# Patient Record
Sex: Female | Born: 1965 | Race: Asian | Hispanic: No | Marital: Married | Smoking: Never smoker
Health system: Southern US, Community
[De-identification: ages and names within clinical notes are randomized; demographics above are authoritative.]

## PROBLEM LIST (undated history)

## (undated) DIAGNOSIS — Z789 Other specified health status: Secondary | ICD-10-CM

## (undated) HISTORY — PX: APPENDECTOMY: SHX54

---

## 1998-10-22 HISTORY — PX: TUBAL LIGATION: SHX77

## 2005-04-23 ENCOUNTER — Ambulatory Visit: Payer: Self-pay | Admitting: Obstetrics and Gynecology

## 2006-04-29 ENCOUNTER — Ambulatory Visit: Payer: Self-pay | Admitting: Obstetrics and Gynecology

## 2007-07-07 ENCOUNTER — Ambulatory Visit: Payer: Self-pay | Admitting: Obstetrics and Gynecology

## 2008-07-12 ENCOUNTER — Ambulatory Visit: Payer: Self-pay | Admitting: Obstetrics and Gynecology

## 2009-07-25 ENCOUNTER — Ambulatory Visit: Payer: Self-pay | Admitting: Obstetrics and Gynecology

## 2010-07-28 ENCOUNTER — Ambulatory Visit: Payer: Self-pay | Admitting: Obstetrics and Gynecology

## 2011-10-18 ENCOUNTER — Ambulatory Visit: Payer: Self-pay | Admitting: Obstetrics and Gynecology

## 2012-11-06 ENCOUNTER — Ambulatory Visit: Payer: Self-pay | Admitting: Obstetrics and Gynecology

## 2014-03-08 ENCOUNTER — Ambulatory Visit: Payer: Self-pay | Admitting: Obstetrics and Gynecology

## 2015-01-27 ENCOUNTER — Other Ambulatory Visit: Payer: Self-pay | Admitting: Obstetrics and Gynecology

## 2015-01-27 DIAGNOSIS — Z1231 Encounter for screening mammogram for malignant neoplasm of breast: Secondary | ICD-10-CM

## 2015-03-10 ENCOUNTER — Ambulatory Visit: Payer: Self-pay

## 2015-03-17 ENCOUNTER — Ambulatory Visit
Admission: RE | Admit: 2015-03-17 | Discharge: 2015-03-17 | Disposition: A | Discharge status: Home / Self Care | Payer: 59 | Source: Ambulatory Visit | Attending: Obstetrics and Gynecology | Admitting: Obstetrics and Gynecology

## 2015-03-17 DIAGNOSIS — Z1231 Encounter for screening mammogram for malignant neoplasm of breast: Secondary | ICD-10-CM | POA: Insufficient documentation

## 2016-04-05 DIAGNOSIS — N921 Excessive and frequent menstruation with irregular cycle: Secondary | ICD-10-CM | POA: Diagnosis not present

## 2016-04-06 ENCOUNTER — Other Ambulatory Visit: Payer: Self-pay | Admitting: Obstetrics and Gynecology

## 2016-04-06 DIAGNOSIS — Z1231 Encounter for screening mammogram for malignant neoplasm of breast: Secondary | ICD-10-CM

## 2016-04-06 DIAGNOSIS — Z0000 Encounter for general adult medical examination without abnormal findings: Secondary | ICD-10-CM | POA: Diagnosis not present

## 2016-04-27 DIAGNOSIS — N921 Excessive and frequent menstruation with irregular cycle: Secondary | ICD-10-CM | POA: Diagnosis not present

## 2016-05-10 ENCOUNTER — Ambulatory Visit: Payer: 59

## 2016-06-14 ENCOUNTER — Ambulatory Visit
Admission: RE | Admit: 2016-06-14 | Discharge: 2016-06-14 | Disposition: A | Discharge status: Home / Self Care | Payer: 59 | Source: Ambulatory Visit | Attending: Obstetrics and Gynecology | Admitting: Obstetrics and Gynecology

## 2016-06-14 DIAGNOSIS — Z1231 Encounter for screening mammogram for malignant neoplasm of breast: Secondary | ICD-10-CM | POA: Insufficient documentation

## 2016-06-21 DIAGNOSIS — E538 Deficiency of other specified B group vitamins: Secondary | ICD-10-CM | POA: Diagnosis not present

## 2016-06-21 DIAGNOSIS — R0789 Other chest pain: Secondary | ICD-10-CM | POA: Diagnosis not present

## 2016-06-21 DIAGNOSIS — R0602 Shortness of breath: Secondary | ICD-10-CM | POA: Diagnosis not present

## 2016-06-28 DIAGNOSIS — I208 Other forms of angina pectoris: Secondary | ICD-10-CM | POA: Diagnosis not present

## 2016-06-28 DIAGNOSIS — R0602 Shortness of breath: Secondary | ICD-10-CM | POA: Diagnosis not present

## 2016-06-28 DIAGNOSIS — R011 Cardiac murmur, unspecified: Secondary | ICD-10-CM | POA: Diagnosis not present

## 2016-07-05 DIAGNOSIS — R079 Chest pain, unspecified: Secondary | ICD-10-CM | POA: Diagnosis not present

## 2016-07-05 DIAGNOSIS — R0602 Shortness of breath: Secondary | ICD-10-CM | POA: Diagnosis not present

## 2016-07-05 DIAGNOSIS — K219 Gastro-esophageal reflux disease without esophagitis: Secondary | ICD-10-CM | POA: Diagnosis not present

## 2016-07-12 DIAGNOSIS — R0602 Shortness of breath: Secondary | ICD-10-CM | POA: Diagnosis not present

## 2016-07-12 DIAGNOSIS — R0789 Other chest pain: Secondary | ICD-10-CM | POA: Diagnosis not present

## 2016-12-20 DIAGNOSIS — E538 Deficiency of other specified B group vitamins: Secondary | ICD-10-CM | POA: Diagnosis not present

## 2016-12-20 DIAGNOSIS — R7989 Other specified abnormal findings of blood chemistry: Secondary | ICD-10-CM | POA: Diagnosis not present

## 2016-12-20 DIAGNOSIS — G43839 Menstrual migraine, intractable, without status migrainosus: Secondary | ICD-10-CM | POA: Diagnosis not present

## 2017-05-16 DIAGNOSIS — E538 Deficiency of other specified B group vitamins: Secondary | ICD-10-CM | POA: Diagnosis not present

## 2017-06-07 ENCOUNTER — Other Ambulatory Visit: Payer: Self-pay | Admitting: Obstetrics and Gynecology

## 2017-06-07 DIAGNOSIS — Z1231 Encounter for screening mammogram for malignant neoplasm of breast: Secondary | ICD-10-CM

## 2017-06-07 DIAGNOSIS — Z01419 Encounter for gynecological examination (general) (routine) without abnormal findings: Secondary | ICD-10-CM | POA: Diagnosis not present

## 2017-06-13 DIAGNOSIS — R7989 Other specified abnormal findings of blood chemistry: Secondary | ICD-10-CM | POA: Diagnosis not present

## 2017-06-13 DIAGNOSIS — G43839 Menstrual migraine, intractable, without status migrainosus: Secondary | ICD-10-CM | POA: Diagnosis not present

## 2017-06-13 DIAGNOSIS — E538 Deficiency of other specified B group vitamins: Secondary | ICD-10-CM | POA: Diagnosis not present

## 2017-06-20 DIAGNOSIS — E538 Deficiency of other specified B group vitamins: Secondary | ICD-10-CM | POA: Diagnosis not present

## 2017-06-21 ENCOUNTER — Other Ambulatory Visit: Payer: Self-pay | Admitting: Internal Medicine

## 2017-06-21 DIAGNOSIS — K760 Fatty (change of) liver, not elsewhere classified: Secondary | ICD-10-CM

## 2017-06-21 DIAGNOSIS — R945 Abnormal results of liver function studies: Secondary | ICD-10-CM

## 2017-06-21 DIAGNOSIS — E538 Deficiency of other specified B group vitamins: Secondary | ICD-10-CM | POA: Diagnosis not present

## 2017-06-21 DIAGNOSIS — R7989 Other specified abnormal findings of blood chemistry: Secondary | ICD-10-CM

## 2017-07-04 ENCOUNTER — Encounter: Payer: Self-pay | Admitting: Radiology

## 2017-07-04 ENCOUNTER — Ambulatory Visit
Admission: RE | Admit: 2017-07-04 | Discharge: 2017-07-04 | Disposition: A | Discharge status: Home / Self Care | Payer: 59 | Source: Ambulatory Visit | Attending: Obstetrics and Gynecology | Admitting: Obstetrics and Gynecology

## 2017-07-04 DIAGNOSIS — Z1231 Encounter for screening mammogram for malignant neoplasm of breast: Secondary | ICD-10-CM

## 2017-07-15 ENCOUNTER — Other Ambulatory Visit: Payer: Self-pay | Admitting: Internal Medicine

## 2017-07-15 DIAGNOSIS — R7989 Other specified abnormal findings of blood chemistry: Secondary | ICD-10-CM

## 2017-07-15 DIAGNOSIS — R945 Abnormal results of liver function studies: Secondary | ICD-10-CM

## 2017-09-07 ENCOUNTER — Other Ambulatory Visit: Payer: Self-pay | Admitting: Internal Medicine

## 2017-09-07 ENCOUNTER — Other Ambulatory Visit: Payer: Self-pay | Admitting: Obstetrics and Gynecology

## 2017-09-07 DIAGNOSIS — Z1231 Encounter for screening mammogram for malignant neoplasm of breast: Secondary | ICD-10-CM

## 2017-09-07 DIAGNOSIS — K760 Fatty (change of) liver, not elsewhere classified: Secondary | ICD-10-CM

## 2017-09-07 DIAGNOSIS — R7989 Other specified abnormal findings of blood chemistry: Secondary | ICD-10-CM

## 2018-01-24 DIAGNOSIS — R87610 Atypical squamous cells of undetermined significance on cytologic smear of cervix (ASC-US): Secondary | ICD-10-CM | POA: Diagnosis not present

## 2018-01-24 DIAGNOSIS — N841 Polyp of cervix uteri: Secondary | ICD-10-CM | POA: Diagnosis not present

## 2018-01-24 DIAGNOSIS — N72 Inflammatory disease of cervix uteri: Secondary | ICD-10-CM | POA: Diagnosis not present

## 2018-02-10 DIAGNOSIS — R7989 Other specified abnormal findings of blood chemistry: Secondary | ICD-10-CM | POA: Diagnosis not present

## 2018-02-10 DIAGNOSIS — M160 Bilateral primary osteoarthritis of hip: Secondary | ICD-10-CM | POA: Diagnosis not present

## 2018-02-10 DIAGNOSIS — M79604 Pain in right leg: Secondary | ICD-10-CM | POA: Diagnosis not present

## 2018-02-10 DIAGNOSIS — E538 Deficiency of other specified B group vitamins: Secondary | ICD-10-CM | POA: Diagnosis not present

## 2018-02-13 DIAGNOSIS — E538 Deficiency of other specified B group vitamins: Secondary | ICD-10-CM | POA: Diagnosis not present

## 2018-02-13 DIAGNOSIS — M79604 Pain in right leg: Secondary | ICD-10-CM | POA: Diagnosis not present

## 2018-02-13 DIAGNOSIS — R7989 Other specified abnormal findings of blood chemistry: Secondary | ICD-10-CM | POA: Diagnosis not present

## 2018-08-18 ENCOUNTER — Ambulatory Visit
Admission: RE | Admit: 2018-08-18 | Discharge: 2018-08-18 | Disposition: A | Discharge status: Home / Self Care | Payer: 59 | Source: Ambulatory Visit | Attending: Obstetrics and Gynecology | Admitting: Obstetrics and Gynecology

## 2018-08-18 ENCOUNTER — Other Ambulatory Visit: Payer: Self-pay

## 2018-08-18 DIAGNOSIS — Z1231 Encounter for screening mammogram for malignant neoplasm of breast: Secondary | ICD-10-CM | POA: Diagnosis present

## 2019-08-09 ENCOUNTER — Other Ambulatory Visit: Payer: Self-pay | Admitting: Obstetrics and Gynecology

## 2019-08-09 DIAGNOSIS — Z1231 Encounter for screening mammogram for malignant neoplasm of breast: Secondary | ICD-10-CM

## 2019-08-27 ENCOUNTER — Ambulatory Visit
Admission: RE | Admit: 2019-08-27 | Discharge: 2019-08-27 | Disposition: A | Discharge status: Home / Self Care | Payer: 59 | Source: Ambulatory Visit | Attending: Obstetrics and Gynecology | Admitting: Obstetrics and Gynecology

## 2019-08-27 DIAGNOSIS — Z1231 Encounter for screening mammogram for malignant neoplasm of breast: Secondary | ICD-10-CM

## 2020-05-08 ENCOUNTER — Other Ambulatory Visit: Payer: Self-pay | Admitting: Orthopedic Surgery

## 2020-05-08 DIAGNOSIS — M25561 Pain in right knee: Secondary | ICD-10-CM

## 2020-06-03 ENCOUNTER — Other Ambulatory Visit: Payer: Self-pay

## 2020-06-03 ENCOUNTER — Ambulatory Visit
Admission: RE | Admit: 2020-06-03 | Discharge: 2020-06-03 | Disposition: A | Discharge status: Home / Self Care | Payer: 59 | Source: Ambulatory Visit | Attending: Orthopedic Surgery | Admitting: Orthopedic Surgery

## 2020-06-03 DIAGNOSIS — M25561 Pain in right knee: Secondary | ICD-10-CM | POA: Insufficient documentation

## 2020-06-17 ENCOUNTER — Other Ambulatory Visit: Payer: Self-pay | Admitting: Orthopedic Surgery

## 2020-06-17 DIAGNOSIS — M25561 Pain in right knee: Secondary | ICD-10-CM

## 2020-06-17 NOTE — Progress Notes (Signed)
Bilateral standing hip to ankle x-rays on one cassette for evaluation of alignment 

## 2020-06-18 ENCOUNTER — Ambulatory Visit
Admission: RE | Admit: 2020-06-18 | Discharge: 2020-06-18 | Disposition: A | Discharge status: Home / Self Care | Payer: 59 | Source: Ambulatory Visit | Attending: Orthopedic Surgery | Admitting: Orthopedic Surgery

## 2020-06-18 ENCOUNTER — Ambulatory Visit
Admission: RE | Admit: 2020-06-18 | Discharge: 2020-06-18 | Disposition: A | Discharge status: Home / Self Care | Payer: 59 | Attending: Orthopedic Surgery | Admitting: Orthopedic Surgery

## 2020-06-18 ENCOUNTER — Other Ambulatory Visit: Payer: Self-pay

## 2020-06-18 DIAGNOSIS — M25561 Pain in right knee: Secondary | ICD-10-CM | POA: Diagnosis not present

## 2020-09-08 ENCOUNTER — Other Ambulatory Visit: Payer: Self-pay | Admitting: Obstetrics and Gynecology

## 2020-09-08 DIAGNOSIS — Z1231 Encounter for screening mammogram for malignant neoplasm of breast: Secondary | ICD-10-CM

## 2020-09-09 ENCOUNTER — Other Ambulatory Visit: Payer: Self-pay | Admitting: Orthopedic Surgery

## 2020-10-06 ENCOUNTER — Encounter: Payer: Self-pay | Admitting: Orthopedic Surgery

## 2020-10-10 ENCOUNTER — Ambulatory Visit
Admission: RE | Admit: 2020-10-10 | Discharge: 2020-10-10 | Disposition: A | Discharge status: Home / Self Care | Payer: 59 | Attending: Orthopedic Surgery | Admitting: Orthopedic Surgery

## 2020-10-10 ENCOUNTER — Encounter
Admission: RE | Disposition: A | Discharge status: Home / Self Care | Payer: Self-pay | Source: Home / Self Care | Attending: Orthopedic Surgery

## 2020-10-10 ENCOUNTER — Other Ambulatory Visit: Payer: Self-pay

## 2020-10-10 ENCOUNTER — Ambulatory Visit: Payer: 59 | Admitting: Anesthesiology

## 2020-10-10 ENCOUNTER — Encounter: Payer: Self-pay | Admitting: Orthopedic Surgery

## 2020-10-10 DIAGNOSIS — X58XXXA Exposure to other specified factors, initial encounter: Secondary | ICD-10-CM | POA: Diagnosis not present

## 2020-10-10 DIAGNOSIS — M238X1 Other internal derangements of right knee: Secondary | ICD-10-CM | POA: Diagnosis not present

## 2020-10-10 DIAGNOSIS — S83241A Other tear of medial meniscus, current injury, right knee, initial encounter: Secondary | ICD-10-CM | POA: Diagnosis present

## 2020-10-10 HISTORY — PX: KNEE ARTHROSCOPY WITH MENISCAL REPAIR: SHX5653

## 2020-10-10 HISTORY — DX: Other specified health status: Z78.9

## 2020-10-10 SURGERY — ARTHROSCOPY, KNEE, WITH MENISCUS REPAIR
Anesthesia: General | Site: Knee | Laterality: Right

## 2020-10-10 MED ORDER — ACETAMINOPHEN 325 MG PO TABS: 325.0000 mg | ORAL_TABLET | ORAL | Status: DC | PRN

## 2020-10-10 MED ORDER — OXYCODONE HCL 5 MG PO TABS: 5.0000 mg | ORAL_TABLET | ORAL | 0 refills | Status: AC | PRN

## 2020-10-10 MED ORDER — ACETAMINOPHEN 160 MG/5ML PO SOLN: 325.0000 mg | ORAL | Status: DC | PRN

## 2020-10-10 MED ORDER — ONDANSETRON HCL 4 MG/2ML IJ SOLN
INTRAMUSCULAR | Status: DC | PRN
  Administered 2020-10-10: 4 mg via INTRAVENOUS

## 2020-10-10 MED ORDER — LACTATED RINGERS IV SOLN: INTRAVENOUS | Status: DC

## 2020-10-10 MED ORDER — DEXAMETHASONE SODIUM PHOSPHATE 10 MG/ML IJ SOLN
INTRAMUSCULAR | Status: DC | PRN
  Administered 2020-10-10: 4 mg via INTRAVENOUS

## 2020-10-10 MED ORDER — ASPIRIN EC 325 MG PO TBEC: 325.0000 mg | DELAYED_RELEASE_TABLET | Freq: Every day | ORAL | 0 refills | Status: AC

## 2020-10-10 MED ORDER — LIDOCAINE-EPINEPHRINE 1 %-1:100000 IJ SOLN
INTRAMUSCULAR | Status: DC | PRN
  Administered 2020-10-10 (×2): 5 mL via INTRAMUSCULAR

## 2020-10-10 MED ORDER — FENTANYL CITRATE (PF) 100 MCG/2ML IJ SOLN
INTRAMUSCULAR | Status: DC | PRN
  Administered 2020-10-10 (×2): 25 ug via INTRAVENOUS
  Administered 2020-10-10: 50 ug via INTRAVENOUS

## 2020-10-10 MED ORDER — OXYCODONE HCL 5 MG PO TABS
5.0000 mg | ORAL_TABLET | Freq: Once | ORAL | Status: AC | PRN
  Administered 2020-10-10: 5 mg via ORAL

## 2020-10-10 MED ORDER — GLYCOPYRROLATE 0.2 MG/ML IJ SOLN
INTRAMUSCULAR | Status: DC | PRN
  Administered 2020-10-10: .2 mg via INTRAVENOUS

## 2020-10-10 MED ORDER — LIDOCAINE HCL (CARDIAC) PF 100 MG/5ML IV SOSY
PREFILLED_SYRINGE | INTRAVENOUS | Status: DC | PRN
  Administered 2020-10-10: 60 mg via INTRAVENOUS

## 2020-10-10 MED ORDER — ACETAMINOPHEN 500 MG PO TABS: 1000.0000 mg | ORAL_TABLET | Freq: Three times a day (TID) | ORAL | 2 refills | Status: AC

## 2020-10-10 MED ORDER — ONDANSETRON 4 MG PO TBDP: 4.0000 mg | ORAL_TABLET | Freq: Three times a day (TID) | ORAL | 0 refills | Status: AC | PRN

## 2020-10-10 MED ORDER — KETOROLAC TROMETHAMINE 30 MG/ML IJ SOLN
INTRAMUSCULAR | Status: DC | PRN
  Administered 2020-10-10: 30 mg via INTRAVENOUS

## 2020-10-10 MED ORDER — ACETAMINOPHEN 10 MG/ML IV SOLN
INTRAVENOUS | Status: DC | PRN
  Administered 2020-10-10: 1000 mg via INTRAVENOUS

## 2020-10-10 MED ORDER — IBUPROFEN 800 MG PO TABS: 800.0000 mg | ORAL_TABLET | Freq: Three times a day (TID) | ORAL | 1 refills | Status: AC

## 2020-10-10 MED ORDER — OXYCODONE HCL 5 MG/5ML PO SOLN: 5.0000 mg | Freq: Once | ORAL | Status: AC | PRN

## 2020-10-10 MED ORDER — KETOROLAC TROMETHAMINE 30 MG/ML IJ SOLN: 15.0000 mg | Freq: Once | INTRAMUSCULAR | Status: DC | PRN

## 2020-10-10 MED ORDER — CEFAZOLIN SODIUM-DEXTROSE 2-4 GM/100ML-% IV SOLN
2.0000 g | INTRAVENOUS | Status: AC
  Administered 2020-10-10: 2 g via INTRAVENOUS

## 2020-10-10 MED ORDER — PROPOFOL 10 MG/ML IV BOLUS
INTRAVENOUS | Status: DC | PRN
  Administered 2020-10-10: 120 mg via INTRAVENOUS
  Administered 2020-10-10: 30 mg via INTRAVENOUS

## 2020-10-10 MED ORDER — ONDANSETRON HCL 4 MG/2ML IJ SOLN: 4.0000 mg | Freq: Once | INTRAMUSCULAR | Status: DC | PRN

## 2020-10-10 MED ORDER — FENTANYL CITRATE PF 50 MCG/ML IJ SOSY
25.0000 ug | PREFILLED_SYRINGE | INTRAMUSCULAR | Status: DC | PRN
  Administered 2020-10-10 (×2): 50 ug via INTRAVENOUS

## 2020-10-10 SURGICAL SUPPLY — 45 items
ADAPTER IRRIG TUBE 2 SPIKE SOL (ADAPTER) ×4 IMPLANT
ADPR TBG 2 SPK PMP STRL ASCP (ADAPTER) ×2
ANCH SUT SWLK 19.1X4.75 (Anchor) ×1 IMPLANT
ANCHOR SUT BIO SW 4.75X19.1 (Anchor) ×1 IMPLANT
APL PRP STRL LF DISP 70% ISPRP (MISCELLANEOUS) ×1
BLADE SURG 15 STRL LF DISP TIS (BLADE) ×1 IMPLANT
BLADE SURG 15 STRL SS (BLADE) ×2
BLADE SURG SZ11 CARB STEEL (BLADE) ×2 IMPLANT
BNDG ESMARK 6X12 TAN STRL LF (GAUZE/BANDAGES/DRESSINGS) ×2 IMPLANT
BUR RADIUS 3.5 (BURR) ×1 IMPLANT
BUR RADIUS 4.0X18.5 (BURR) ×1 IMPLANT
CHLORAPREP W/TINT 26 (MISCELLANEOUS) ×2 IMPLANT
COOLER POLAR GLACIER W/PUMP (MISCELLANEOUS) ×2 IMPLANT
COVER LIGHT HANDLE UNIVERSAL (MISCELLANEOUS) ×4 IMPLANT
DRAPE IMP U-DRAPE 54X76 (DRAPES) ×2 IMPLANT
GAUZE SPONGE 4X4 12PLY STRL (GAUZE/BANDAGES/DRESSINGS) ×2 IMPLANT
GLOVE SURG ENC MOIS LTX SZ7.5 (GLOVE) ×6 IMPLANT
GOWN STRL REUS W/ TWL LRG LVL3 (GOWN DISPOSABLE) ×1 IMPLANT
GOWN STRL REUS W/TWL LRG LVL3 (GOWN DISPOSABLE) ×4
IMP SYS 2ND FIX PEEK 4.75X19.1 (Miscellaneous) ×2 IMPLANT
IMPL SYS 2ND FX PEEK 4.75X19.1 (Miscellaneous) IMPLANT
IMPL SYS MENISCAL ROOT REPAIR (Orthopedic Implant) ×1 IMPLANT
IV LACTATED RINGER IRRG 3000ML (IV SOLUTION) ×8
IV LR IRRIG 3000ML ARTHROMATIC (IV SOLUTION) ×4 IMPLANT
KIT TURNOVER KIT A (KITS) ×2 IMPLANT
MANIFOLD NEPTUNE II (INSTRUMENTS) ×2 IMPLANT
MAT ABSORB  FLUID 56X50 GRAY (MISCELLANEOUS) ×2
MAT ABSORB FLUID 56X50 GRAY (MISCELLANEOUS) ×2 IMPLANT
PACK ARTHROSCOPY KNEE (MISCELLANEOUS) ×2 IMPLANT
PAD WRAPON POLAR KNEE (MISCELLANEOUS) ×1 IMPLANT
PADDING CAST BLEND 6X4 STRL (MISCELLANEOUS) ×1 IMPLANT
PADDING STRL CAST 6IN (MISCELLANEOUS) ×1
PENCIL SMOKE EVACUATOR (MISCELLANEOUS) ×2 IMPLANT
SET TUBE SUCT SHAVER OUTFL 24K (TUBING) ×2 IMPLANT
SET TUBE TIP INTRA-ARTICULAR (MISCELLANEOUS) ×2 IMPLANT
SUT ETHILON 3 0 FSLX (SUTURE) ×2 IMPLANT
SUT MNCRL 4-0 (SUTURE) ×2
SUT MNCRL 4-0 27XMFL (SUTURE) ×1
SUT VIC AB 2-0 CT2 27 (SUTURE) ×1 IMPLANT
SUT VIC AB 3-0 SH 27 (SUTURE) ×2
SUT VIC AB 3-0 SH 27X BRD (SUTURE) IMPLANT
SUTURE MNCRL 4-0 27XMF (SUTURE) IMPLANT
TUBING ARTHRO INFLOW-ONLY STRL (TUBING) ×2 IMPLANT
WAND WEREWOLF FLOW 90D (MISCELLANEOUS) ×2 IMPLANT
WRAPON POLAR PAD KNEE (MISCELLANEOUS) ×2

## 2020-10-10 NOTE — Transfer of Care (Signed)
Immediate Anesthesia Transfer of Care Note  Patient: Andrea Le  Procedure(s) Performed: Right knee medial meniscus root repair, chondroplasty (Right: Knee)  Patient Location: PACU  Anesthesia Type: General  Level of Consciousness: awake, alert  and patient cooperative  Airway and Oxygen Therapy: Patient Spontanous Breathing and Patient connected to supplemental oxygen  Post-op Assessment: Post-op Vital signs reviewed, Patient's Cardiovascular Status Stable, Respiratory Function Stable, Patent Airway and No signs of Nausea or vomiting  Post-op Vital Signs: Reviewed and stable  Complications: No notable events documented.

## 2020-10-10 NOTE — H&P (Signed)
Paper H&P to be scanned into permanent record. H&P reviewed. No significant changes noted.  

## 2020-10-10 NOTE — Anesthesia Preprocedure Evaluation (Signed)
Anesthesia Evaluation  Patient identified by MRN, date of birth, ID band Patient awake    Reviewed: Allergy & Precautions, NPO status , Patient's Chart, lab work & pertinent test results  Airway Mallampati: II  TM Distance: >3 FB Neck ROM: Full    Dental no notable dental hx.    Pulmonary neg pulmonary ROS,    Pulmonary exam normal breath sounds clear to auscultation       Cardiovascular Exercise Tolerance: Good negative cardio ROS Normal cardiovascular exam Rhythm:Regular Rate:Normal     Neuro/Psych negative neurological ROS  negative psych ROS   GI/Hepatic negative GI ROS, Neg liver ROS,   Endo/Other  negative endocrine ROS  Renal/GU negative Renal ROS  negative genitourinary   Musculoskeletal  (+) Arthritis ,   Abdominal Normal abdominal exam  (+) - obese,  Abdomen: soft.    Peds negative pediatric ROS (+)  Hematology negative hematology ROS (+)   Anesthesia Other Findings   Reproductive/Obstetrics negative OB ROS                             Anesthesia Physical Anesthesia Plan  ASA: 3  Anesthesia Plan: General   Post-op Pain Management:    Induction:   PONV Risk Score and Plan: Treatment may vary due to age or medical condition and Midazolam  Airway Management Planned: LMA  Additional Equipment:   Intra-op Plan:   Post-operative Plan:   Informed Consent:     Dental advisory given  Plan Discussed with: CRNA  Anesthesia Plan Comments:         Anesthesia Quick Evaluation  There are no problems to display for this patient.   No flowsheet data found. No flowsheet data found.  Risks and benefits of anesthesia discussed at length, patient or surrogate demonstrates understanding. Appropriately NPO. Plan to proceed with anesthesia.  Corlis Leak, MD 10/10/20

## 2020-10-10 NOTE — Op Note (Signed)
DATE: 10/10/2020   PRE-OP DIAGNOSIS:  1. Right medial meniscus root tear 2. Right Medial compartment and patellofemoral compartment degenerative changes  POST-OP DIAGNOSIS:  1. Right medial meniscus root tear 2. Right Medial compartment and patellofemoral compartment degenerative changes 3. Right suprapatellar pouch adhesions  PROCEDURES:  1. Right knee medial meniscus root repair  2. Right knee lysis of adhesions in suprapatellar pouch   SURGEON:  Novella Olive, MD   ASSISTANT(S):  Dedra Skeens, [place]   ANESTHESIA: general   TOTAL IV FLUIDS: See anesthesia record   ESTIMATED BLOOD LOSS: 5cc   TOURNIQUET TIME:    DRAINS:  None.   SPECIMENS: None   IMPLANTS:  - Arthrex Biocomposite SwiveLock (x1)     COMPLICATIONS: None   INDICATIONS: Andrea Le is a 55 y.o. female with R knee pain that has failed non-operative management. Clinical exam and radiographic studies were notable for right knee pain and swelling with instances of giving way with associated popping and catching on the medial aspect of her knee. Additionally, MRI showed a medial meniscus root tear with mild degenerative changes to medial and patellofemoral compartments with neutral mechanical alignment. Given the poor long-term prognosis of a meniscus root tear and high likelihood of significant progression of osteoarthritis, we elected to proceed with the above procedure after a discussion of the risks, benefits, and alternatives to surgery.    OPERATIVE FINDINGS:    Examination under anesthesia: A careful examination under anesthesia was performed.  Passive range of motion was: Hyperextension: 2.  Extension: 0.  Flexion: 120.  Lachman: normal. Pivot Shift: normal.  Posterior drawer: normal.  Varus stability in full extension: normal.  Varus stability in 30 degrees of flexion: normal.  Valgus stability in full extension: normal.  Valgus stability in 30 degrees of flexion: normal.    Intra-operative findings: A thorough arthroscopic examination of the knee was performed.  The findings are: 1. Suprapatellar pouch: Significant adhesions about entirety of suprapatellar pouch with decreased volume 2. Undersurface of median ridge: Focal Grade 2 degenerative changes 3. Medial patellar facet: Grade 1 degenerative changes 4. Lateral patellar facet: Grade 1 degenerative changes 5. Trochlea: Grade 3 degenerative changes over central aspect of trochlear groove 6. Lateral gutter/popliteus tendon: Normal 7. Hoffa's fat pad: Inflamed 8. Medial gutter/plica: Normal 9. ACL: Normal 10. PCL: Normal 11. Medial meniscus: Complete tear of the medial meniscus at the posterior root 12. Medial compartment cartilage: diffuse areas of Grade 3 degenerative changes on MFC with relatively normal immediate weight bearing portion of MFC, grade 2 changes on medial aspect of the tibial plateau 13. Lateral meniscus: Normal 14. Lateral compartment cartilage: Normal  DESCRIPTION OF PROCEDURE: I identified Andrea Le in the pre-operative holding area.  I marked the operative knee with my initials. I reviewed the risks and benefits of the proposed surgical intervention and the patient (and/or patient's guardian) wished to proceed. The patient was transferred to the operative suite and placed in the supine position with all bony prominences padded.  Anesthesia was administered. Appropriate IV antibiotics were administered prior to incision. The extremity was then prepped and draped in standard fashion. A time out was performed confirming the correct extremity, correct patient, and correct procedure.   Arthroscopy portals were marked. Local anesthetic was injected to the planned portal sites. The anterolateral portal was established with an 11 blade. The arthroscope was placed in the anterolateral portal and then into the suprapatellar pouch.  A diagnostic knee scope was completed with the above  findings. Next the medial portal was established under needle localization. A shaver was used to perform a lysis of adhesions in the suprapatellar pouch. Afterwards, there was a more appropriate suprapatellar pouch volume.  Next, the MCL was pie-crusted to improve visualization of the posterior horn. The meniscus root tear was identified and probed to confirm our findings. Next, an Arthrex meniscus root aiming guide was used to mark out the tibial incision. An approximately 4cm vertical incision was made medial to the tibial tubercle. This was carried down to the sartorius fascia with bovie electrocautery, and the fascia was incised. An elevator was used to clear periosteum from the tibia in the area of the anticipated bone tunnel. Hemostasis was achieved. The guide was reinserted into the tibia and was placed over the anatomic footprint of the medial meniscus root. We then used a 6.77mm FlipCutter to drill into the tibia and create a 61mm socket for the meniscus root. A FiberStick was passed through our tibial tunnel and into the joint. This was retrieved and passed through the anterolateral portal.   Next, using a Meniscus Scorpion, an 0-FiberLink suture was passed just medial to the meniscus root in a luggage tag configuration.  A second stitch was passed in a similar fashion medial to the first stitch.  This allowed for excellent purchase of the meniscus root.  We ensured there was no soft tissue bridge between the sutures and pulled the passing stitch from the FiberStick through the anteromedial portal. We then used the passing stitch to bring the sutures in the meniscus out through the tibial tunnel. These were then passed through an [employer] anchor. Anchor hole was drilled ~3cm distal previously drilled tibial tunnel. Anchor was inserted with appropriate tension while visualizing the repair with the arthroscope, and this achieved excellent interference fit. The meniscus root was probed and found to  be stable.   Any loose bony debris was removed from the knee joint with a shaver and excess fluid was evacuated from the joint. Closure of the portals with 3-0 Nylon was performed. The sartorius fascia was closed with 2-0 vicryl. The subdermal layer of the tibial incision was closed with 2-0 vicryl and the skin was closed with 4-0 Monocryl in a running fashion and Dermabond.  Xeroform gauze and dry sterile dressings were applied. A PolarCare and hinged knee brace were also applied.   Instrument, sponge, and needle counts were correct prior to wound closure and at the conclusion of the case.   Of note, assistance from a PA was essential to performing the surgery.  PA was present for the entire surgery.  PA assisted with patient positioning, retraction, instrumentation, and wound closure. The surgery would have been more difficult and had longer operative time without PA assistance.   Additionally, this case had increased complexity compared to standard meniscus repair given that the patient had a complete tear of the meniscus root. Repair of this tear involved making a separate open incision, drilling a tunnel through the tibia, and using an implant in the tibia for fixation, all of which would otherwise not occur for standard meniscal repairs.  DISPOSITION: PACU - hemodynamically stable.    POSTOPERATIVE PLAN: The patient will be discharged home today.     Non-weight bearing x 4 weeks. 50% WB from weeks 4-6. ASA for DVT ppx x 4 weeks, Narcotic medication, NSAID, and acetaminophen as discussed pre-operatively. The patient will be attending physical therapy beginning 3-4 days post-op. Physical therapy per Meniscus Root Repair Rehab Guidelines.  Patient to return to clinic 10-14 days postop for suture removal.

## 2020-10-10 NOTE — Discharge Instructions (Signed)
Arthroscopic Knee Surgery - Meniscus Repair   Post-Op Instructions   1. Bracing or crutches: Crutches will be provided at the time of discharge from the surgery center. Keep brace locked in extension at all times except as directed by physical therapy.    2. Ice: You may be provided with a device (Polar Care) that allows you to ice the affected area effectively. Otherwise you can ice manually.    3. Driving:  Plan on not driving for at least four weeks. Please note that you are advised NOT to drive while taking narcotic pain medications as you may be impaired and unsafe to drive.   4. Activity: Ankle pumps several times an hour while awake to prevent blood clots. Weight bearing: NO WEIGHT BEARING FOR 4 WEEKS. Use crutches for at least 4 weeks, if not 6 based on your surgery. Bending and straightening the knee is unlimited, but do not flex your knee past 90 degrees until cleared by your therapist. Elevate knee above heart level as much as possible for one week. Avoid standing more than 5 minutes (consecutively) for the first week. No exercise involving the knee until cleared by the surgeon or physical therapist.  Avoid long distance travel for 4 weeks.   5. Medications:  - You have been provided a prescription for narcotic pain medicine. After surgery, take 1-2 narcotic tablets every 4 hours if needed for severe pain. If it has tylenol (acetaminophen), please do not take a total of more than 3000mg/day of tylenol.  - A prescription for anti-nausea medication will be provided in case the narcotic medicine causes nausea - take 1 tablet every 6 hours only if nauseated.  - Take ibuprofen 800 mg every 8 hours with food to reduce post-operative knee swelling. DO NOT STOP IBUPROFEN POST-OP UNTIL INSTRUCTED TO DO SO at first post-op office visit (10-14 days after surgery).  - Take enteric coated aspirin 325 mg once daily for 4 weeks to prevent blood clots.  -Take tylenol 1000 every 8 hours for pain.  May  stop tylenol 3 days after surgery or when you are having minimal pain. If your narcotic has tylenol (acetaminophen), please do not take a total of more than 3000mg/day of tylenol.    If you are taking prescription medication for anxiety, depression, insomnia, muscle spasm, chronic pain, or for attention deficit disorder you are advised that you are at a higher risk of adverse effects with use of narcotics post-op, including narcotic addiction/dependence, depressed breathing, death. If you use non-prescribed substances: alcohol, marijuana, cocaine, heroin, methamphetamines, etc., you are at a higher risk of adverse effects with use of narcotics post-op, including narcotic addiction/dependence, depressed breathing, death. You are advised that taking > 50 morphine milligram equivalents (MME) of narcotic pain medication per day results in twice the risk of overdose or death. For your prescription provided: oxycodone 5 mg - taking more than 6 tablets per day. Be advised that we will prescribe narcotics short-term, for acute post-operative pain only - 1 week for minor operations such as knee arthroscopy for meniscus tear resection, and 3 weeks for major operations such as knee repair/reconstruction surgeries.   6. Bandages: The physical therapist should change the bandages at the first post-op appointment. If needed, the dressing supplies have been provided to you. You may shower after this with waterproof bandaids covering the incisions.    7. Physical Therapy: 2 times per week for the first 4 weeks, then 1-2 times per week from weeks 4-8 post-op. Therapy typically   starts on post operative Day 3 or 4. You have been provided an order for physical therapy. The therapist will provide home exercises.   8. Work: May return to full work when off of crutches. May do light duty/desk job in approximately 1-2 weeks when off of narcotics, pain is well-controlled, and swelling has decreased.   9. Post-Op  Appointments: Your first post-op appointment will be with Dr. Cozzolino in approximately 2 weeks time.    If you find that they have not been scheduled please call the Orthopaedic Appointment front desk at [phone].   

## 2020-10-10 NOTE — Anesthesia Procedure Notes (Signed)
Procedure Name: LMA Insertion Date/Time: 10/10/2020 7:45 AM Performed by: Lily Kocher, CRNA Pre-anesthesia Checklist: Patient identified, Patient being monitored, Timeout performed, Emergency Drugs available and Suction available Patient Re-evaluated:Patient Re-evaluated prior to induction Oxygen Delivery Method: Circle system utilized Preoxygenation: Pre-oxygenation with 100% oxygen Induction Type: IV induction Ventilation: Mask ventilation without difficulty LMA: LMA inserted LMA Size: 4.0 Tube type: Oral Number of attempts: 1 Placement Confirmation: positive ETCO2 and breath sounds checked- equal and bilateral Tube secured with: Tape Dental Injury: Teeth and Oropharynx as per pre-operative assessment

## 2020-10-10 NOTE — Anesthesia Postprocedure Evaluation (Signed)
Anesthesia Post Note  Patient: Andrea Le  Procedure(s) Performed: Right knee athroscopy with lysis of adheisions, medial meniscus root repair and chondroplasty (Right: Knee)     Patient location during evaluation: PACU Anesthesia Type: General Level of consciousness: awake and alert Pain management: pain level controlled Vital Signs Assessment: post-procedure vital signs reviewed and stable Respiratory status: spontaneous breathing, nonlabored ventilation, respiratory function stable and patient connected to nasal cannula oxygen Cardiovascular status: blood pressure returned to baseline and stable Postop Assessment: no apparent nausea or vomiting Anesthetic complications: no   No notable events documented.  Edwyna Ready

## 2020-10-13 ENCOUNTER — Encounter: Payer: Self-pay | Admitting: Orthopedic Surgery

## 2021-01-26 ENCOUNTER — Inpatient Hospital Stay: Admission: RE | Admit: 2021-01-26 | Payer: 59 | Source: Ambulatory Visit

## 2021-06-04 ENCOUNTER — Ambulatory Visit
Admission: RE | Admit: 2021-06-04 | Discharge: 2021-06-04 | Disposition: A | Discharge status: Home / Self Care | Payer: 59 | Source: Ambulatory Visit | Attending: Obstetrics and Gynecology | Admitting: Obstetrics and Gynecology

## 2021-06-04 DIAGNOSIS — Z1231 Encounter for screening mammogram for malignant neoplasm of breast: Secondary | ICD-10-CM | POA: Insufficient documentation

## 2021-06-11 ENCOUNTER — Other Ambulatory Visit: Payer: Self-pay | Admitting: Obstetrics and Gynecology

## 2021-06-11 DIAGNOSIS — R928 Other abnormal and inconclusive findings on diagnostic imaging of breast: Secondary | ICD-10-CM

## 2021-06-11 DIAGNOSIS — N63 Unspecified lump in unspecified breast: Secondary | ICD-10-CM

## 2021-06-26 ENCOUNTER — Ambulatory Visit
Admission: RE | Admit: 2021-06-26 | Discharge: 2021-06-26 | Disposition: A | Discharge status: Home / Self Care | Payer: 59 | Source: Ambulatory Visit | Attending: Obstetrics and Gynecology | Admitting: Obstetrics and Gynecology

## 2021-06-26 DIAGNOSIS — R928 Other abnormal and inconclusive findings on diagnostic imaging of breast: Secondary | ICD-10-CM | POA: Diagnosis present

## 2021-06-26 DIAGNOSIS — N63 Unspecified lump in unspecified breast: Secondary | ICD-10-CM

## 2021-07-01 ENCOUNTER — Other Ambulatory Visit: Payer: Self-pay | Admitting: Obstetrics and Gynecology

## 2021-07-01 DIAGNOSIS — N63 Unspecified lump in unspecified breast: Secondary | ICD-10-CM

## 2021-07-01 DIAGNOSIS — R928 Other abnormal and inconclusive findings on diagnostic imaging of breast: Secondary | ICD-10-CM

## 2021-07-21 ENCOUNTER — Ambulatory Visit
Admission: RE | Admit: 2021-07-21 | Discharge: 2021-07-21 | Disposition: A | Discharge status: Home / Self Care | Payer: 59 | Source: Ambulatory Visit | Attending: Obstetrics and Gynecology | Admitting: Obstetrics and Gynecology

## 2021-07-21 DIAGNOSIS — N63 Unspecified lump in unspecified breast: Secondary | ICD-10-CM | POA: Insufficient documentation

## 2021-07-21 DIAGNOSIS — R928 Other abnormal and inconclusive findings on diagnostic imaging of breast: Secondary | ICD-10-CM

## 2021-07-21 HISTORY — PX: BREAST BIOPSY: SHX20

## 2021-07-22 LAB — SURGICAL PATHOLOGY

## 2021-07-31 ENCOUNTER — Encounter: Payer: Self-pay | Admitting: *Deleted

## 2021-07-31 NOTE — Progress Notes (Signed)
Referral recieved from [facility] for benign breast mass.  Appointment scheduled for surgical consultation with Dr. Lemar Livings per patient request.   No further needs at this time.

## 2021-12-02 ENCOUNTER — Other Ambulatory Visit: Payer: Self-pay | Admitting: General Surgery

## 2021-12-02 DIAGNOSIS — R928 Other abnormal and inconclusive findings on diagnostic imaging of breast: Secondary | ICD-10-CM

## 2021-12-02 DIAGNOSIS — Z1231 Encounter for screening mammogram for malignant neoplasm of breast: Secondary | ICD-10-CM

## 2021-12-14 NOTE — Addendum Note (Signed)
Addended by: Robert Bellow on: 12/14/2021 11:42 AM   Modules accepted: Orders

## 2022-01-06 ENCOUNTER — Ambulatory Visit
Admission: RE | Admit: 2022-01-06 | Discharge: 2022-01-06 | Disposition: A | Discharge status: Home / Self Care | Payer: 59 | Source: Ambulatory Visit | Attending: General Surgery | Admitting: General Surgery

## 2022-01-06 DIAGNOSIS — R928 Other abnormal and inconclusive findings on diagnostic imaging of breast: Secondary | ICD-10-CM

## 2022-05-27 ENCOUNTER — Other Ambulatory Visit: Payer: Self-pay | Admitting: General Surgery

## 2022-05-27 DIAGNOSIS — N63 Unspecified lump in unspecified breast: Secondary | ICD-10-CM

## 2022-06-23 IMAGING — US US BREAST*L* LIMITED INC AXILLA
2 series · 8 of 8 positions shown · non-contrast
Comparison: Previous exam(s).

CLINICAL DATA: Recall from screening mammography, possible new mass
involving the UPPER OUTER QUADRANT of the LEFT breast at posterior
depth.

EXAM:
DIGITAL DIAGNOSTIC UNILATERAL LEFT MAMMOGRAM WITH TOMOSYNTHESIS AND
CAD; ULTRASOUND LEFT BREAST LIMITED
TECHNIQUE: Left digital diagnostic mammography and breast tomosynthesis was
performed. The images were evaluated with computer-aided detection.;
Targeted ultrasound examination of the left breast was performed.

[Series 1: us breast*left* limited inc axilla · 0.06mm/px · 7 of 7 slices shown (1 of 2)]
[im 1/7]
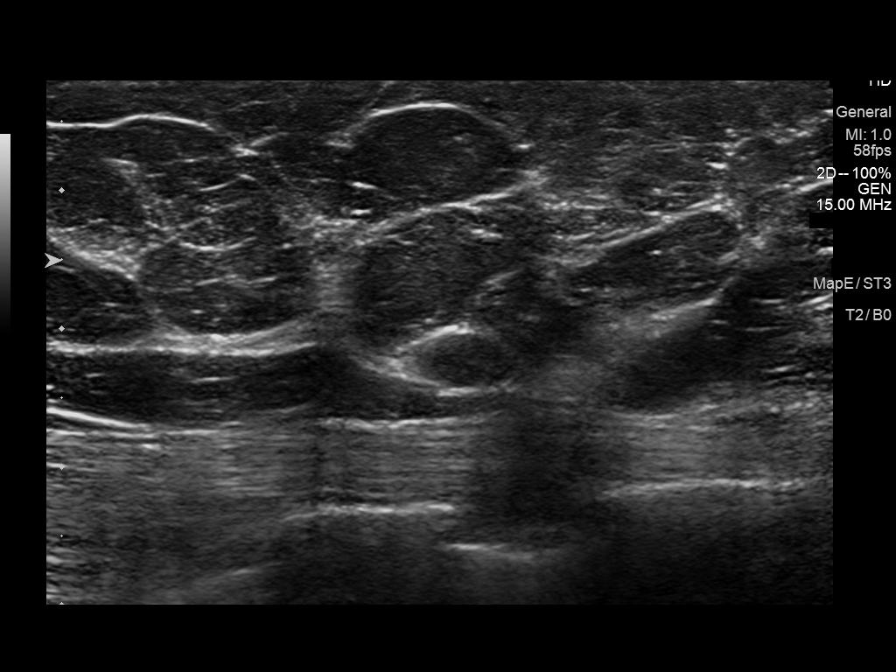
[im 2/7]
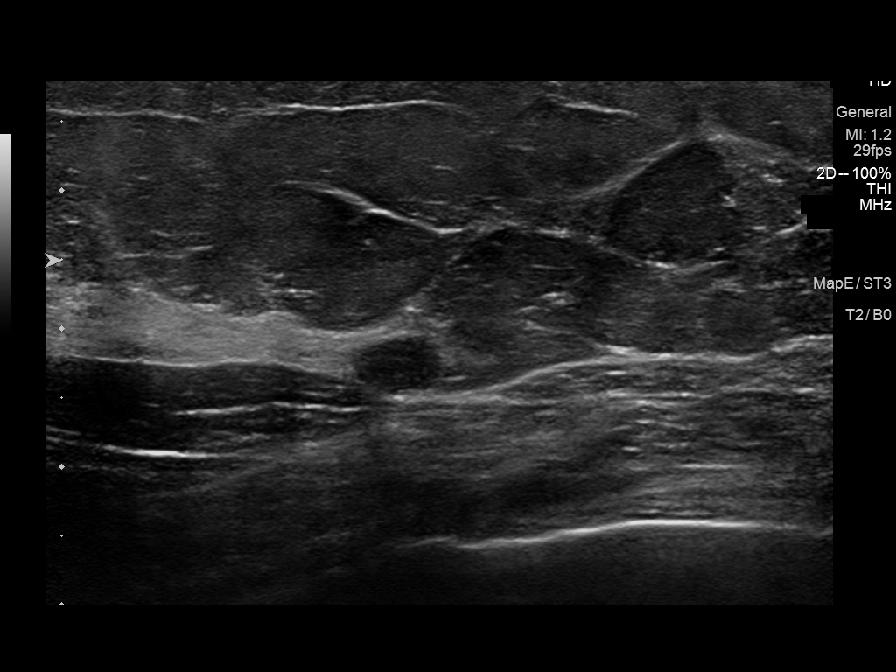
[im 3/7]
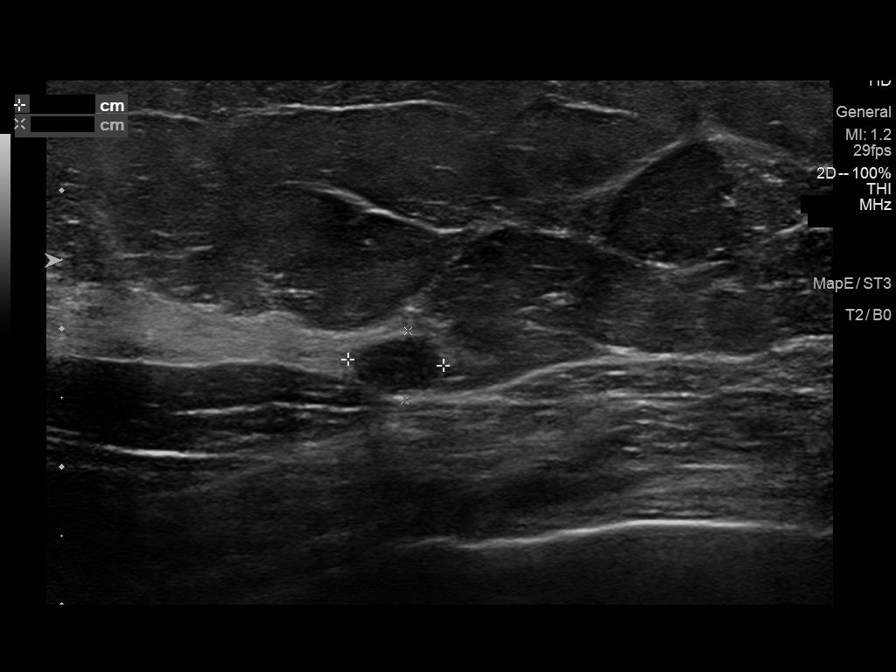
[im 4/7]
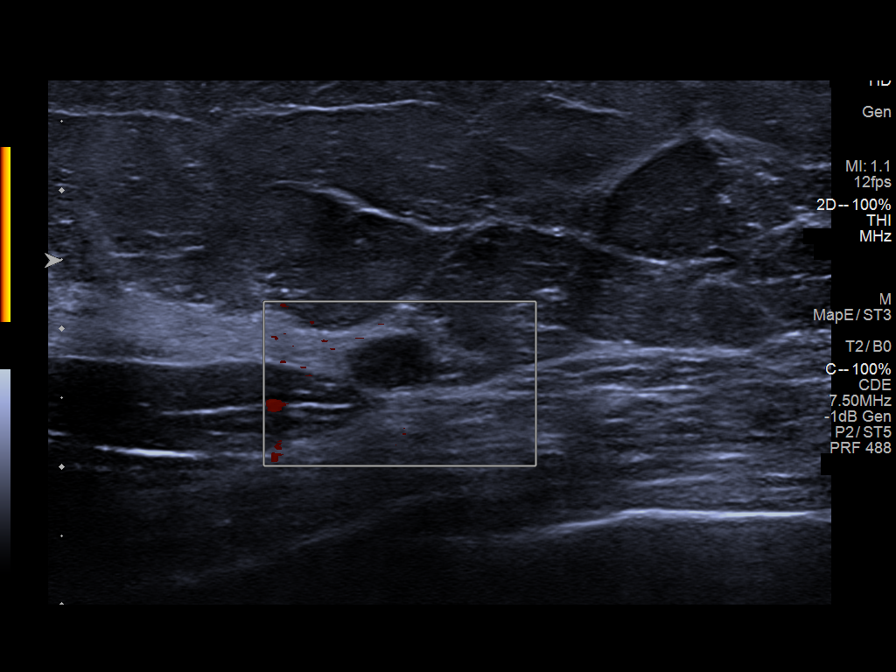
[im 5/7]
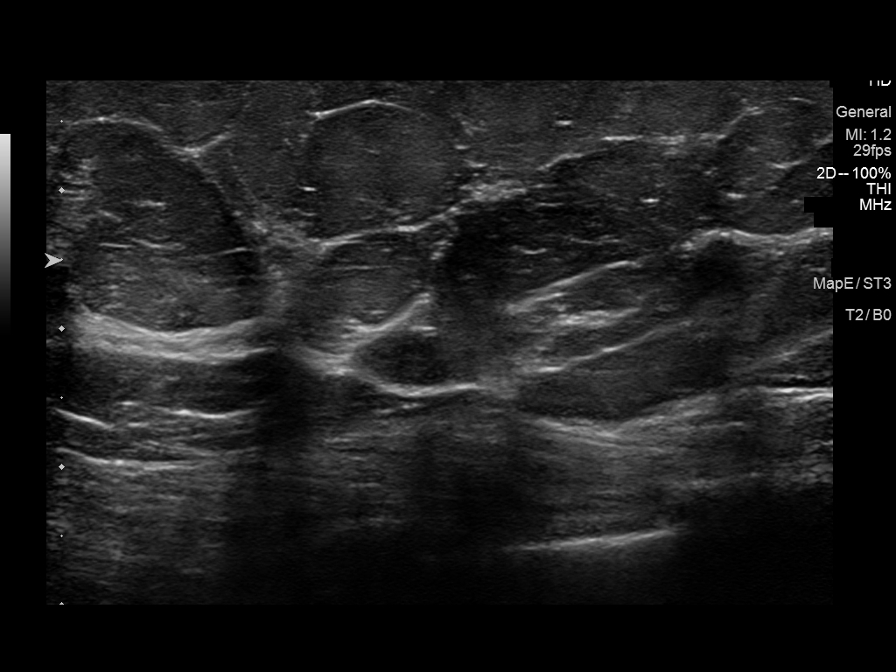
[im 6/7]
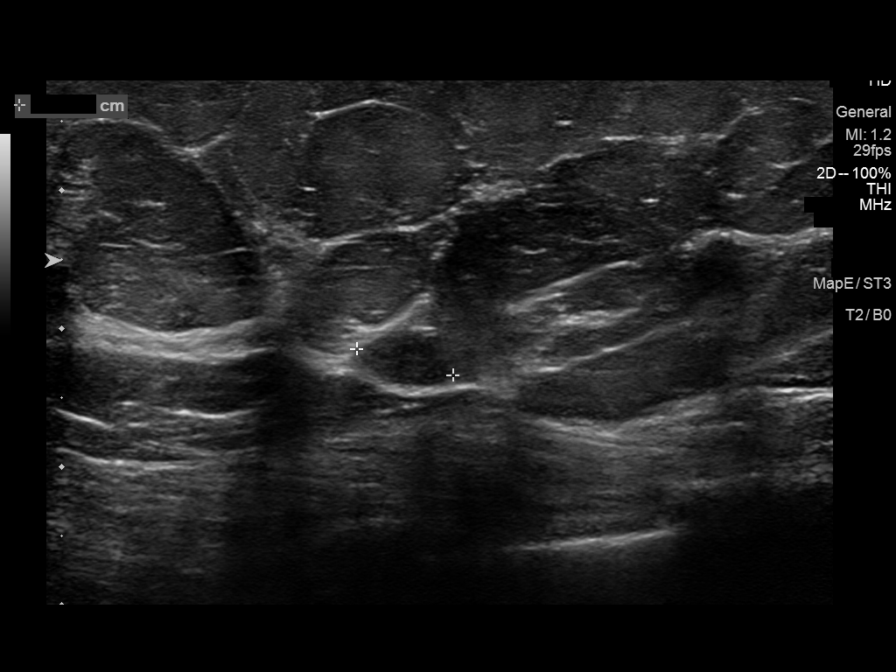
[im 7/7]
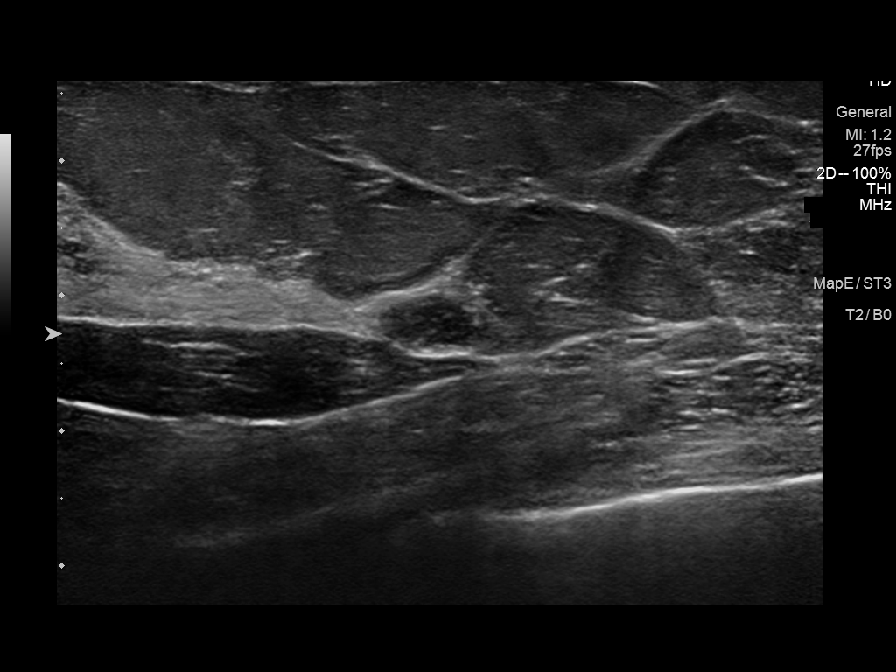

[Series 2: us breast*left* limited inc axilla · 0.08mm/px · 1 of 1 slices shown (2 of 2)]
[im 1/1]
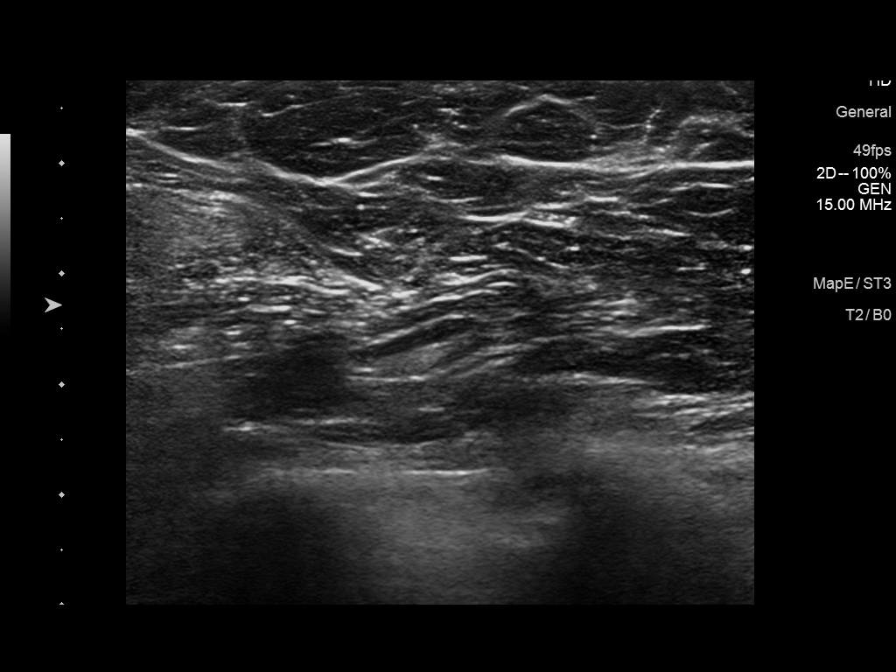

[8 of 8 positions shown; findings below may reference images not displayed]

ACR Breast Density Category b: There are scattered areas of
fibroglandular density.
FINDINGS: Spot-compression CC and MLO views of the area of concern were
obtained.

There is an isodense mass in the UPPER OUTER QUADRANT at posterior
depth measuring just under 1 cm in size. There is no associated
architectural distortion or suspicious calcifications.

Targeted ultrasound is performed, demonstrating an oval parallel
hypoechoic mass with somewhat vague margins at the 3 o'clock
position 7 cm from the nipple at posterior depth, measuring
approximately 0.7 x 0.5 x 0.7 cm, demonstrating no posterior
characteristics and no internal power Doppler flow, corresponding to
the new screening mammographic finding.

Sonographic evaluation of the LEFT axilla demonstrates no pathologic
lymphadenopathy
IMPRESSION: 1. Indeterminate new 0.7 cm mass involving the outer LEFT breast at
the 3 o'clock position 7 cm from the nipple.
2. No pathologic LEFT axillary lymphadenopathy.

RECOMMENDATION:
Ultrasound-guided core needle biopsy of the indeterminate LEFT
breast mass.

I have discussed the findings and recommendations with the patient
and her husband. The ultrasound core needle biopsy procedure was
discussed and their questions were answered. The patient will be
contacted by the Breast Navigator in order to schedule the biopsy.

BI-RADS CATEGORY  4: Suspicious.

## 2022-07-08 ENCOUNTER — Other Ambulatory Visit: Payer: 59

## 2022-07-27 ENCOUNTER — Ambulatory Visit
Admission: RE | Admit: 2022-07-27 | Discharge: 2022-07-27 | Disposition: A | Discharge status: Home / Self Care | Payer: 59 | Source: Ambulatory Visit | Attending: General Surgery | Admitting: General Surgery

## 2022-07-27 DIAGNOSIS — N63 Unspecified lump in unspecified breast: Secondary | ICD-10-CM | POA: Insufficient documentation

## 2023-05-24 ENCOUNTER — Other Ambulatory Visit: Payer: Self-pay | Admitting: General Surgery

## 2023-05-24 DIAGNOSIS — Z853 Personal history of malignant neoplasm of breast: Secondary | ICD-10-CM

## 2023-07-28 ENCOUNTER — Other Ambulatory Visit

## 2023-07-28 ENCOUNTER — Encounter

## 2023-08-10 ENCOUNTER — Other Ambulatory Visit

## 2023-08-10 ENCOUNTER — Encounter

## 2023-09-09 ENCOUNTER — Encounter

## 2023-09-09 ENCOUNTER — Other Ambulatory Visit

## 2023-09-13 ENCOUNTER — Ambulatory Visit
Admission: RE | Admit: 2023-09-13 | Discharge: 2023-09-13 | Disposition: A | Discharge status: Home / Self Care | Source: Ambulatory Visit | Attending: General Surgery | Admitting: General Surgery

## 2023-09-13 DIAGNOSIS — Z853 Personal history of malignant neoplasm of breast: Secondary | ICD-10-CM
